# Patient Record
Sex: Female | Born: 2000 | Race: Black or African American | Hispanic: No | Marital: Single | Smoking: Never smoker
Health system: Southern US, Community
[De-identification: ages and names within clinical notes are randomized; demographics above are authoritative.]

## PROBLEM LIST (undated history)

## (undated) DIAGNOSIS — K219 Gastro-esophageal reflux disease without esophagitis: Secondary | ICD-10-CM

## (undated) DIAGNOSIS — J45909 Unspecified asthma, uncomplicated: Secondary | ICD-10-CM

## (undated) DIAGNOSIS — R569 Unspecified convulsions: Secondary | ICD-10-CM

## (undated) HISTORY — PX: MOUTH SURGERY: SHX715

## (undated) HISTORY — DX: Unspecified convulsions: R56.9

---

## 2018-03-20 ENCOUNTER — Encounter (HOSPITAL_COMMUNITY): Payer: Self-pay | Admitting: *Deleted

## 2018-03-20 ENCOUNTER — Emergency Department (HOSPITAL_COMMUNITY)
Admission: EM | Admit: 2018-03-20 | Discharge: 2018-03-20 | Disposition: A | Discharge status: Home / Self Care | Payer: BLUE CROSS/BLUE SHIELD | Attending: Emergency Medicine | Admitting: Emergency Medicine

## 2018-03-20 ENCOUNTER — Emergency Department (HOSPITAL_COMMUNITY): Payer: BLUE CROSS/BLUE SHIELD

## 2018-03-20 DIAGNOSIS — R1031 Right lower quadrant pain: Secondary | ICD-10-CM | POA: Insufficient documentation

## 2018-03-20 DIAGNOSIS — J45909 Unspecified asthma, uncomplicated: Secondary | ICD-10-CM | POA: Diagnosis not present

## 2018-03-20 DIAGNOSIS — M549 Dorsalgia, unspecified: Secondary | ICD-10-CM

## 2018-03-20 HISTORY — DX: Unspecified asthma, uncomplicated: J45.909

## 2018-03-20 LAB — COMPREHENSIVE METABOLIC PANEL
ALK PHOS: 54 U/L (ref 38–126)
ALT: 16 U/L (ref 0–44)
AST: 24 U/L (ref 15–41)
Albumin: 3.5 g/dL (ref 3.5–5.0)
Anion gap: 9 (ref 5–15)
BILIRUBIN TOTAL: 0.7 mg/dL (ref 0.3–1.2)
BUN: 7 mg/dL (ref 6–20)
CALCIUM: 8.6 mg/dL — AB (ref 8.9–10.3)
CHLORIDE: 105 mmol/L (ref 98–111)
CO2: 24 mmol/L (ref 22–32)
CREATININE: 0.8 mg/dL (ref 0.44–1.00)
Glucose, Bld: 98 mg/dL (ref 70–99)
Potassium: 3.6 mmol/L (ref 3.5–5.1)
Sodium: 138 mmol/L (ref 135–145)
TOTAL PROTEIN: 7.5 g/dL (ref 6.5–8.1)

## 2018-03-20 LAB — CBC WITH DIFFERENTIAL/PLATELET
Basophils Absolute: 0 10*3/uL (ref 0.0–0.1)
Basophils Relative: 0 %
EOS PCT: 0 %
Eosinophils Absolute: 0 10*3/uL (ref 0.0–0.7)
HEMATOCRIT: 36 % (ref 36.0–46.0)
Hemoglobin: 11.4 g/dL — ABNORMAL LOW (ref 12.0–15.0)
LYMPHS ABS: 1.8 10*3/uL (ref 0.7–4.0)
LYMPHS PCT: 18 %
MCH: 27.3 pg (ref 26.0–34.0)
MCHC: 31.7 g/dL (ref 30.0–36.0)
MCV: 86.3 fL (ref 78.0–100.0)
Monocytes Absolute: 1.6 10*3/uL — ABNORMAL HIGH (ref 0.1–1.0)
Monocytes Relative: 16 %
NEUTROS ABS: 6.6 10*3/uL (ref 1.7–7.7)
Neutrophils Relative %: 66 %
Platelets: 299 10*3/uL (ref 150–400)
RBC: 4.17 MIL/uL (ref 3.87–5.11)
RDW: 14.9 % (ref 11.5–15.5)
WBC: 9.9 10*3/uL (ref 4.0–10.5)

## 2018-03-20 LAB — URINALYSIS, ROUTINE W REFLEX MICROSCOPIC
BILIRUBIN URINE: NEGATIVE
Glucose, UA: NEGATIVE mg/dL
Ketones, ur: NEGATIVE mg/dL
NITRITE: NEGATIVE
PH: 6 (ref 5.0–8.0)
Protein, ur: 30 mg/dL — AB
SPECIFIC GRAVITY, URINE: 1.004 — AB (ref 1.005–1.030)
WBC, UA: >50 WBC/hpf — ABNORMAL HIGH (ref 0–5)

## 2018-03-20 LAB — LIPASE, BLOOD: Lipase: 25 U/L (ref 11–51)

## 2018-03-20 LAB — PREGNANCY, URINE: PREG TEST UR: NEGATIVE

## 2018-03-20 MED ORDER — KETOROLAC TROMETHAMINE 15 MG/ML IJ SOLN
15.0000 mg | Freq: Once | INTRAMUSCULAR | Status: AC
  Administered 2018-03-20: 15 mg via INTRAVENOUS
  Filled 2018-03-20: qty 1

## 2018-03-20 MED ORDER — ONDANSETRON HCL 4 MG PO TABS: 4.0000 mg | ORAL_TABLET | Freq: Three times a day (TID) | ORAL | 0 refills | Status: DC | PRN

## 2018-03-20 MED ORDER — NAPROXEN 500 MG PO TABS: 500.0000 mg | ORAL_TABLET | Freq: Two times a day (BID) | ORAL | 0 refills | Status: DC

## 2018-03-20 MED ORDER — SODIUM CHLORIDE 0.9 % IV BOLUS
1000.0000 mL | Freq: Once | INTRAVENOUS | Status: AC
  Administered 2018-03-20: 1000 mL via INTRAVENOUS

## 2018-03-20 MED ORDER — ONDANSETRON HCL 4 MG/2ML IJ SOLN
4.0000 mg | Freq: Once | INTRAMUSCULAR | Status: AC
  Administered 2018-03-20: 4 mg via INTRAVENOUS
  Filled 2018-03-20: qty 2

## 2018-03-20 NOTE — ED Provider Notes (Signed)
[facility]-EMERGENCY DEPT Provider Note   CSN: [id number] Arrival date & time: 03/20/18  1319     History   Chief Complaint Chief Complaint  Patient presents with  . Flank Pain    HPI Brittney Garner is a 17 y.o. female presenting for evaluation of right-sided flank pain.  Patient states the past 2 days, she has been having right-sided flank pain.  Patient states is gradually been getting worse.  She denies triggering episode.  She denies increased activity or movement recently.  She denies fall, trauma, or injury.  She reports pain occasionally radiates down to her foot.  She has taken 1 dose of extra strength Tylenol without improvement of her symptoms, has not taken anything else.  She reports decreased appetite for the past day and a half.  She denies headache, fevers, chills, vision changes, chest pain, shortness of breath, nausea, vomiting, abdominal pain, urinary symptoms, abnormal bowel movements.  She has a history of asthma and allergies, for which he takes Zyrtec and Qvar.  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  Recently moved, however had movers to help her.  HPI  Past Medical History:  Diagnosis Date  . Asthma     There are no active problems to display for this patient.   History reviewed. No pertinent surgical history.   OB History   None      Home Medications    Prior to Admission medications   Medication Sig Start Date End Date Taking? Authorizing Provider  naproxen (NAPROSYN) 500 MG tablet Take 1 tablet (500 mg total) by mouth 2 (two) times daily with a meal. 03/20/18   Zyier Dykema, PA-C  ondansetron (ZOFRAN) 4 MG tablet Take 1 tablet (4 mg total) by mouth every 8 (eight) hours as needed. 03/20/18   Chrystopher Stangl, PA-C    Family History No family history on file.  Social History Social History   Tobacco Use  . Smoking status: Not on file  Substance Use Topics  . Alcohol use: Not on file  . Drug  use: Not on file     Allergies   Patient has no allergy information on record.   Review of Systems Review of Systems  Constitutional: Positive for appetite change.  Genitourinary: Positive for flank pain.  Musculoskeletal: Positive for back pain.  All other systems reviewed and are negative.    Physical Exam Updated Vital Signs BP 104/65 (BP Location: Right Arm)   Pulse (!) 120 Comment: pt crying  Temp 99 F (37.2 C) (Oral)   Resp 16   Ht 5\' 5"  (1.651 m)   Wt 67.1 kg   LMP 03/13/2018 Comment: negative urine pregnancy test 03-20-2018  SpO2 100%   BMI 24.63 kg/m   Physical Exam  Constitutional: She is oriented to person, place, and time. She appears well-developed and well-nourished. No distress.  Sitting in the chair in no acute distress  HENT:  Head: Normocephalic and atraumatic.  Eyes: Pupils are equal, round, and reactive to light. Conjunctivae and EOM are normal.  Neck: Normal range of motion. Neck supple.  Cardiovascular: Regular rhythm and intact distal pulses.  Tachycardic around 105  Pulmonary/Chest: Effort normal and breath sounds normal. No respiratory distress. She has no wheezes.  Abdominal: Soft. She exhibits no distension and no mass. There is no tenderness. There is no guarding.  No tenderness palpation of the anterior abdomen.  Soft without rigidity, guarding, distention.  Negative rebound.  No signs of peritonitis.  Musculoskeletal: Normal range of motion.  Tenderness palpation of right flank and low back musculature.  Strength intact x4.  Sensation intact x4.  Radial pedal pulses intact bilaterally.  No tenderness palpation over midline spine.  Neurological: She is alert and oriented to person, place, and time. No sensory deficit.  Skin: Skin is warm and dry. Capillary refill takes less than 2 seconds.  Psychiatric: She has a normal mood and affect.  Nursing note and vitals reviewed.    ED Treatments / Results  Labs (all labs ordered are listed,  but only abnormal results are displayed) Labs Reviewed  URINALYSIS, ROUTINE W REFLEX MICROSCOPIC - Abnormal; Notable for the following components:      Result Value   APPearance CLOUDY (*)    Specific Gravity, Urine 1.004 (*)    Hgb urine dipstick MODERATE (*)    Protein, ur 30 (*)    Leukocytes, UA LARGE (*)    WBC, UA >50 (*)    Bacteria, UA RARE (*)    All other components within normal limits  CBC WITH DIFFERENTIAL/PLATELET - Abnormal; Notable for the following components:   Hemoglobin 11.4 (*)    Monocytes Absolute 1.6 (*)    All other components within normal limits  COMPREHENSIVE METABOLIC PANEL - Abnormal; Notable for the following components:   Calcium 8.6 (*)    All other components within normal limits  URINE CULTURE  PREGNANCY, URINE  LIPASE, BLOOD    EKG None  Radiology Ct Renal Stone Study  Result Date: 03/20/2018 CLINICAL DATA:  Initial evaluation for acute right flank pain. EXAM: CT ABDOMEN AND PELVIS WITHOUT CONTRAST TECHNIQUE: Multidetector CT imaging of the abdomen and pelvis was performed following the standard protocol without IV contrast. COMPARISON:  None available. FINDINGS: Lower chest: Visualized lung bases are clear. Hepatobiliary: Limited noncontrast evaluation of the liver is unremarkable. Gallbladder within normal limits. No biliary dilatation. Pancreas: Pancreas within normal limits. Spleen: Spleen within normal limits. Adrenals/Urinary Tract: Adrenal glands unremarkable. Kidneys equal in size without nephrolithiasis or hydronephrosis. Right kidney mildly externally rotated. No radiopaque calculi seen along the course of either renal collecting system. No appreciable hydroureter. Partially distended bladder within normal limits. No layering stones within the bladder lumen. Punctate calcification within the left hemipelvis most consistent with a vascular phlebolith. Stomach/Bowel: Stomach within normal limits. No evidence for bowel obstruction. Normal  appendix. No acute inflammatory changes seen about the bowels. Vascular/Lymphatic: Intra-abdominal aorta of normal caliber. No appreciable adenopathy. Reproductive: Uterus and ovaries within normal limits. Other: No free air or fluid. Musculoskeletal: No acute osseous abnormality. No worrisome lytic or blastic osseous lesions. IMPRESSION: 1. No CT evidence for nephrolithiasis or obstructive uropathy. 2. No other acute intra-abdominal or pelvic process identified. Electronically Signed   By: Rise MuBenjamin  McClintock M.D.   On: 03/20/2018 15:39    Procedures Procedures (including critical care time)  Medications Ordered in ED Medications  sodium chloride 0.9 % bolus 1,000 mL (0 mLs Intravenous Stopped 03/20/18 1617)  ketorolac (TORADOL) 15 MG/ML injection 15 mg (15 mg Intravenous Given 03/20/18 1543)  ondansetron (ZOFRAN) injection 4 mg (4 mg Intravenous Given 03/20/18 1543)     Initial Impression / Assessment and Plan / ED Course  I have reviewed the triage vital signs and the nursing notes.  Pertinent labs & imaging results that were available during my care of the patient were reviewed by me and considered in my medical decision making (see chart for details).     Patient presenting for evaluation of  right-sided back/flank pain.  History points to multiple different potential etiologies.  Reporting radiation to her feet, consistent with sciatica.  However, patient also reporting nausea/decreased appetite.  CVA tenderness on the side.  Urine with moderate blood and large leuks.  No urinary symptoms such as hematuria, dysuria, urinary frequency.  Will not empirically treat for UTI, as patient is asymptomatic.  Will obtain labs, CT renal, treat symptoms, and reassess.  On reassessment, patient reports improvement of symptoms.  She states she is pain-free.  Heart rate has improved to 90.  Appears in no distress.  CT renal negative for stone, or obvious intra-abdominal infection.  Labs reassuring, no  leukocytosis.  Kidney, liver, pancreatic function reassuring.  Discussed with patient.  Discussed treatment with NSAIDs, muscle creams, and stretching.  Encouraged follow-up with PCP as needed.  Urine culture sent.  At this time, patient appears safe for discharge.  Return precautions given.  Patient states she understands and agrees to plan.  Final Clinical Impressions(s) / ED Diagnoses   Final diagnoses:  Acute right-sided back pain, unspecified back location    ED Discharge Orders         Ordered    naproxen (NAPROSYN) 500 MG tablet  2 times daily with meals     03/20/18 1704    ondansetron (ZOFRAN) 4 MG tablet  Every 8 hours PRN     03/20/18 1704           Urijah Arko, PA-C 03/20/18 1715    Linwood Dibbles, MD 03/23/18 [phone]

## 2018-03-20 NOTE — ED Triage Notes (Signed)
Pt complains of right flank pain and weakness x 2 days. Pain is worse with movement. Pt denies urinary symptoms or fever.

## 2018-03-20 NOTE — Discharge Instructions (Signed)
While in the ER today, we checked to make sure you do not have a kidney stone, kidney infection, intra-abdominal infection, or other acute or concerning pathology. Take naproxen twice a day with meals.  Do not take other anti-inflammatory's at the same time. Use Zofran as needed for nausea or vomiting.  Make sure you are staying well-hydrated.  This is very important. Your urine was sent for culture, if it grows out bacteria, you will receive a phone call to discuss the need to start antibiotics. Try the back stretches that are provided in the paperwork. Use muscle cream such as salonpas, icy hot, or BenGay to help with your pain. Follow-up with student health or your primary care doctor if your symptoms are not improving with the treatment. Return to the emergency room if you develop any new, worsening, or concerning symptoms.

## 2018-03-22 LAB — URINE CULTURE

## 2018-03-23 ENCOUNTER — Telehealth: Payer: Self-pay | Admitting: Emergency Medicine

## 2018-03-23 NOTE — Telephone Encounter (Signed)
Post ED Visit - Positive Culture Follow-up  Culture report reviewed by antimicrobial stewardship pharmacist:  []  Brittney Garner, Pharm.D. []  Brittney Garner, Pharm.D., BCPS AQ-ID []  Brittney Garner, Pharm.D., BCPS []  Brittney Garner, Pharm.D., BCPS []  Brittney Garner, [address]Pharm.D., BCPS, AAHIVP []  Brittney Garner, Pharm.D., BCPS, AAHIVP []  Brittney Garner, PharmD, BCPS []  Brittney Garner, PharmD, BCPS []  Brittney Garner, PharmD, BCPS []  Brittney Garner, PharmD  Positive urine culture Treated with none, asymptomatic,no further patient follow-up is required at this time.  Brittney Garner, Brittney Garner 03/23/2018, 3:52 PM

## 2018-06-15 ENCOUNTER — Emergency Department (HOSPITAL_COMMUNITY)
Admission: EM | Admit: 2018-06-15 | Discharge: 2018-06-16 | Disposition: A | Discharge status: Home / Self Care | Payer: BLUE CROSS/BLUE SHIELD | Attending: Emergency Medicine | Admitting: Emergency Medicine

## 2018-06-15 ENCOUNTER — Encounter (HOSPITAL_COMMUNITY): Payer: Self-pay

## 2018-06-15 DIAGNOSIS — Y999 Unspecified external cause status: Secondary | ICD-10-CM | POA: Diagnosis not present

## 2018-06-15 DIAGNOSIS — Z1889 Other specified retained foreign body fragments: Secondary | ICD-10-CM | POA: Diagnosis not present

## 2018-06-15 DIAGNOSIS — Y929 Unspecified place or not applicable: Secondary | ICD-10-CM | POA: Diagnosis not present

## 2018-06-15 DIAGNOSIS — Y939 Activity, unspecified: Secondary | ICD-10-CM | POA: Insufficient documentation

## 2018-06-15 DIAGNOSIS — T162XXA Foreign body in left ear, initial encounter: Secondary | ICD-10-CM

## 2018-06-15 DIAGNOSIS — Z79899 Other long term (current) drug therapy: Secondary | ICD-10-CM | POA: Insufficient documentation

## 2018-06-15 DIAGNOSIS — X58XXXA Exposure to other specified factors, initial encounter: Secondary | ICD-10-CM | POA: Diagnosis not present

## 2018-06-15 DIAGNOSIS — Z91040 Latex allergy status: Secondary | ICD-10-CM | POA: Diagnosis not present

## 2018-06-15 DIAGNOSIS — J45901 Unspecified asthma with (acute) exacerbation: Secondary | ICD-10-CM | POA: Diagnosis not present

## 2018-06-15 DIAGNOSIS — S0991XA Unspecified injury of ear, initial encounter: Secondary | ICD-10-CM | POA: Diagnosis present

## 2018-06-15 HISTORY — DX: Gastro-esophageal reflux disease without esophagitis: K21.9

## 2018-06-15 MED ORDER — ALBUTEROL SULFATE (2.5 MG/3ML) 0.083% IN NEBU
5.0000 mg | INHALATION_SOLUTION | Freq: Once | RESPIRATORY_TRACT | Status: AC
  Administered 2018-06-15: 5 mg via RESPIRATORY_TRACT
  Filled 2018-06-15: qty 6

## 2018-06-15 NOTE — ED Triage Notes (Signed)
Pt states that she had an asthma attack today, took home medications without relief, dry cough and chest tightness with coughing

## 2018-06-16 ENCOUNTER — Emergency Department (HOSPITAL_COMMUNITY): Payer: BLUE CROSS/BLUE SHIELD

## 2018-06-16 MED ORDER — LIDOCAINE HCL (PF) 1 % IJ SOLN
5.0000 mL | Freq: Once | INTRAMUSCULAR | Status: AC
  Administered 2018-06-16: 5 mL
  Filled 2018-06-16: qty 5

## 2018-06-16 MED ORDER — PREDNISONE 20 MG PO TABS
60.0000 mg | ORAL_TABLET | Freq: Once | ORAL | Status: AC
  Administered 2018-06-16: 60 mg via ORAL
  Filled 2018-06-16: qty 3

## 2018-06-16 MED ORDER — IPRATROPIUM-ALBUTEROL 0.5-2.5 (3) MG/3ML IN SOLN
3.0000 mL | Freq: Once | RESPIRATORY_TRACT | Status: AC
  Administered 2018-06-16: 3 mL via RESPIRATORY_TRACT
  Filled 2018-06-16: qty 3

## 2018-06-16 MED ORDER — IBUPROFEN 800 MG PO TABS
800.0000 mg | ORAL_TABLET | Freq: Once | ORAL | Status: AC
  Administered 2018-06-16: 800 mg via ORAL
  Filled 2018-06-16: qty 1

## 2018-06-16 MED ORDER — PREDNISONE 20 MG PO TABS: 40.0000 mg | ORAL_TABLET | Freq: Every day | ORAL | 0 refills | Status: DC

## 2018-06-16 NOTE — ED Notes (Signed)
ED Provider at bedside. 

## 2018-06-16 NOTE — ED Provider Notes (Signed)
[facility] EMERGENCY DEPARTMENT Provider Note   CSN: [id number] Arrival date & time: 06/15/18  2340     History   Chief Complaint Chief Complaint  Patient presents with  . Asthma    HPI Brittney Garner is a 17 y.o. female.  Patient presents to the emergency department with a chief complaint of asthma exacerbation.  She reports associated dry cough and chest tightness.  The symptoms are worsened with coughing.  She denies any fevers or chills.  She does report having history of pneumothorax.  She has tried using her at-home inhalers with little relief.  Additionally, patient complains of foreign body in left earlobe.  She denies any discharge or swelling at the site.  The history is provided by the patient. No language interpreter was used.    Past Medical History:  Diagnosis Date  . Asthma   . GERD (gastroesophageal reflux disease)     There are no active problems to display for this patient.   History reviewed. No pertinent surgical history.   OB History   None      Home Medications    Prior to Admission medications   Medication Sig Start Date End Date Taking? Authorizing Provider  naproxen (NAPROSYN) 500 MG tablet Take 1 tablet (500 mg total) by mouth 2 (two) times daily with a meal. 03/20/18   Caccavale, Sophia, PA-C  ondansetron (ZOFRAN) 4 MG tablet Take 1 tablet (4 mg total) by mouth every 8 (eight) hours as needed. 03/20/18   Caccavale, Sophia, PA-C    Family History No family history on file.  Social History Social History   Tobacco Use  . Smoking status: Not on file  Substance Use Topics  . Alcohol use: Not on file  . Drug use: Not on file     Allergies   Iodine; Other; Shellfish allergy; and Latex   Review of Systems Review of Systems  All other systems reviewed and are negative.    Physical Exam Updated Vital Signs BP 124/73   Pulse 99   Temp 98.4 F (36.9 C) (Oral)   Resp 18   LMP 06/01/2018   SpO2 98%    Physical Exam  Constitutional: She is oriented to person, place, and time. She appears well-developed and well-nourished.  HENT:  Head: Normocephalic and atraumatic.  Stud type earring is embedded in the left earlobe  Eyes: Pupils are equal, round, and reactive to light. Conjunctivae and EOM are normal.  Neck: Normal range of motion. Neck supple.  Cardiovascular: Normal rate and regular rhythm. Exam reveals no gallop and no friction rub.  No murmur heard. Pulmonary/Chest: Effort normal. No respiratory distress. She has no wheezes. She has no rales. She exhibits no tenderness.  Diminished lung sounds  Abdominal: Soft. Bowel sounds are normal. She exhibits no distension and no mass. There is no tenderness. There is no rebound and no guarding.  Musculoskeletal: Normal range of motion. She exhibits no edema or tenderness.  Neurological: She is alert and oriented to person, place, and time.  Skin: Skin is warm and dry.  Psychiatric: She has a normal mood and affect. Her behavior is normal. Judgment and thought content normal.  Nursing note and vitals reviewed.    ED Treatments / Results  Labs (all labs ordered are listed, but only abnormal results are displayed) Labs Reviewed - No data to display  EKG None  Radiology No results found.  Procedures .Foreign Body Removal Date/Time: 06/16/2018 1:46 AM Performed by: Roxy Horseman, PA-C  Authorized by: Roxy HorsemanBrowning, Ember Gottwald, PA-C  Consent: Verbal consent obtained. Risks and benefits: risks, benefits and alternatives were discussed Patient understanding: patient states understanding of the procedure being performed Patient consent: the patient's understanding of the procedure matches consent given Procedure consent: procedure consent matches procedure scheduled Relevant documents: relevant documents present and verified Test results: test results available and properly labeled Site marked: the operative site was marked Imaging  studies: imaging studies available Required items: required blood products, implants, devices, and special equipment available Patient identity confirmed: verbally with patient Time out: Immediately prior to procedure a "time out" was called to verify the correct patient, procedure, equipment, support staff and site/side marked as required. Intake: left ear lobe. Anesthesia: local infiltration  Anesthesia: Local Anesthetic: lidocaine 1% without epinephrine  Sedation: Patient sedated: no  Patient restrained: no Patient cooperative: yes Complexity: simple 1 objects recovered. Objects recovered: ear ring Post-procedure assessment: foreign body removed Patient tolerance: Patient tolerated the procedure well with no immediate complications   (including critical care time)  Medications Ordered in ED Medications  predniSONE (DELTASONE) tablet 60 mg (has no administration in time range)  ipratropium-albuterol (DUONEB) 0.5-2.5 (3) MG/3ML nebulizer solution 3 mL (has no administration in time range)  lidocaine (PF) (XYLOCAINE) 1 % injection 5 mL (has no administration in time range)  albuterol (PROVENTIL) (2.5 MG/3ML) 0.083% nebulizer solution 5 mg (5 mg Nebulization Given 06/15/18 2358)     Initial Impression / Assessment and Plan / ED Course  I have reviewed the triage vital signs and the nursing notes.  Pertinent labs & imaging results that were available during my care of the patient were reviewed by me and considered in my medical decision making (see chart for details).     Patient with mild asthma exacerbation.  Has had good improvement with breathing treatment and steroid in the ED.  Has a history of pneumothorax, but chest x-ray reveals no pneumothorax.  No evidence of infection.  O2 sat is normal.  Patient has an no acute distress.  Additionally, patient complains of foreign body in her left earlobe.  She has a hearing that is ingrown.  This was removed in the ED.  Final  Clinical Impressions(s) / ED Diagnoses   Final diagnoses:  Exacerbation of asthma, unspecified asthma severity, unspecified whether persistent  Ear foreign body, left, initial encounter    ED Discharge Orders         Ordered    predniSONE (DELTASONE) 20 MG tablet  Daily     06/16/18 0145           Roxy HorsemanBrowning, Jaaron Oleson, PA-C 06/16/18 0148    Dione BoozeGlick, David, MD 06/16/18 [phone]

## 2018-06-16 NOTE — ED Notes (Signed)
Patient transported to X-ray 

## 2018-08-24 ENCOUNTER — Encounter: Payer: Self-pay | Admitting: Neurology

## 2018-09-24 ENCOUNTER — Ambulatory Visit: Payer: BLUE CROSS/BLUE SHIELD | Admitting: Neurology

## 2018-09-24 ENCOUNTER — Encounter: Payer: Self-pay | Admitting: Neurology

## 2018-09-24 ENCOUNTER — Other Ambulatory Visit: Payer: Self-pay

## 2018-09-24 VITALS — BP 118/72 | HR 71 | Ht 65.0 in | Wt 160.0 lb

## 2018-09-24 DIAGNOSIS — G40309 Generalized idiopathic epilepsy and epileptic syndromes, not intractable, without status epilepticus: Secondary | ICD-10-CM | POA: Diagnosis not present

## 2018-09-24 MED ORDER — ZONISAMIDE 100 MG PO CAPS: ORAL_CAPSULE | ORAL | 11 refills | Status: DC

## 2018-09-24 MED ORDER — LEVETIRACETAM 1000 MG PO TABS: 1000.0000 mg | ORAL_TABLET | Freq: Two times a day (BID) | ORAL | 2 refills | Status: DC
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
## 2018-09-24 NOTE — Patient Instructions (Addendum)
1. Records from [facility] will be requested for review  2. Start Zonisamide 100mg : Take 1 capsule every night for 2 weeks, then increase to 2 capsules every night for 2 weeks, then increase to 3 capsules every night and continue  3. Continue Keppra 1000mg  twice a day for another 6 weeks as we increase the Zonisamide. Once on 3 caps of new medication, reduce Keppra to 1/2 tablet twice a day for 1 week, the 1/2 at night for a week, then stop  4. You can try 1/2 tablet of the Ativan as needed around the time of your period and when you feel you are about to have a seizure  5. Recommend talking to your school counselor  6. Contact our office in 2 months on how you are feeling, follow-up in 3 months, call for any issues  Seizure Precautions: 1. If medication has been prescribed for you to prevent seizures, take it exactly as directed.  Do not stop taking the medicine without talking to your doctor first, even if you have not had a seizure in a long time.   2. Avoid activities in which a seizure would cause danger to yourself or to others.  Don't operate dangerous machinery, swim alone, or climb in high or dangerous places, such as on ladders, roofs, or girders.  Do not drive unless your doctor says you may.  3. If you have any warning that you may have a seizure, lay down in a safe place where you can't hurt yourself.    4.  No driving for 6 months from last seizure, as per [facility].   Please refer to the following link on the Epilepsy Foundation of America's website for more information: http://www.epilepsyfoundation.org/answerplace/Social/driving/drivingu.cfm   5.  Maintain good sleep hygiene. Avoid alcohol  6.  Notify your neurology if you are planning pregnancy or if you become pregnant.  7.  Contact your doctor if you have any problems that may be related to the medicine you are taking.  8.  Call 911 and bring the patient back to the ED if:        A.  The seizure lasts longer  than 5 minutes.       B.  The patient doesn't awaken shortly after the seizure  C.  The patient has new problems such as difficulty seeing, speaking or moving  D.  The patient was injured during the seizure  E.  The patient has a temperature over 102 F (39C)  F.  The patient vomited and now is having trouble breathing

## 2018-10-19 ENCOUNTER — Telehealth: Payer: Self-pay | Admitting: Neurology

## 2018-10-19 NOTE — Telephone Encounter (Signed)
Medication Keppra and other pill (red and white) is keeping her awake and then she is sleeping for a long time when she finally falls asleep. She is really sluggish during the day. Please call her back at [phone]. Thanks!

## 2018-10-20 NOTE — Telephone Encounter (Signed)
How much of zonisamide is she taking now, 1 or 2 caps? If 2, reduce to 1. If 1, reduce to every other night. How may seizures in the past month has she had? Thanks

## 2018-10-20 NOTE — Telephone Encounter (Signed)
Returned call to pt to gather information below as well as to advise about medications.  No answer.  LMOM asking for return call to the office to get this information.

## 2018-10-20 NOTE — Telephone Encounter (Signed)
I'm assuming she is talking about Zonisamide.  Pls advise

## 2018-10-22 ENCOUNTER — Telehealth: Payer: Self-pay

## 2018-10-22 NOTE — Telephone Encounter (Signed)
Spoke with pt.  She states that she had increased her Zonisamide to 3 cap QHS last night.  I advised her to take 2 caps tonight, then 1 cap QHS thereafter.    Advised that I would write requested letter x2.  Pt requests that letters be mail to her home via [employer].  Verified pt's mailing address.  Pt states that she her legs have been falling asleep more often.  States that she has to "roll out of bed" because they are asleep.  Asks if this could be cause by her medications or due to her seizures?  pls advise.

## 2018-10-22 NOTE — Telephone Encounter (Signed)
Ok for letter for work and school. Thanks for updating on med concerns/issues, hopefully we can stay on zonisamide but there are other medications we can try if necessary. Thanks

## 2018-10-22 NOTE — Telephone Encounter (Signed)
Received VM from pt stating that she experienced a seizure on Tuesday and her school/work (could not really understand which) was concerned that she did not seek medical attention.  States that she would like a letter for work and school stating that seizures do not always require medical attention, especially now.    Will return call in regards to previous phone encounter [facility] for pt, did not receive call back)  Dr. Karel Jarvis - pls advise about letter.

## 2018-10-22 NOTE — Telephone Encounter (Signed)
I don't think they are due to the seizures. Zonisamide can sometimes cause numbness/tingling in arms and legs. Let's try going back down to 1 cap every night and see how she does. Thanks

## 2018-10-23 NOTE — Telephone Encounter (Signed)
Spoke with pt relaying message below.  She asks if she is to stop Keppra now.  I advised that she still has 2 weeks left per taper instructions.  Pt states that she took 1 Tab Keppra yesterday.  I advised her to start half tab BID today x1week, then reduce to half tab QHS x1week then stop Keppra all together.  Pt verbalized understanding.

## 2018-10-29 ENCOUNTER — Ambulatory Visit: Payer: BLUE CROSS/BLUE SHIELD | Admitting: Neurology

## 2018-10-29 ENCOUNTER — Encounter

## 2018-11-25 ENCOUNTER — Telehealth: Payer: Self-pay | Admitting: Neurology

## 2018-11-25 NOTE — Telephone Encounter (Signed)
Patient called to check on her appointment date and time which we confirmed for her. She also wanted to know if she would still be contacted to schedule her EEG for home. Please Call. Thanks

## 2018-11-25 NOTE — Telephone Encounter (Signed)
Pt called no answer voice mail left that we are not doing EEG testing at this time and that when we are able to do them someone from the office will be calling her to get her scheduled.

## 2018-12-11 ENCOUNTER — Telehealth: Payer: Self-pay | Admitting: *Deleted

## 2018-12-11 NOTE — Telephone Encounter (Signed)
LMOM we can set up her 72 hour ambulatory EEG appointment for Tuesday May 26th at 2:30 to return Friday May 29th at 3:30. Or Friday May 29th 3:30 to Monday June 1st 4:30 for removal. Asked her to return my call with her calendar handy if neither of those times work for her.

## 2018-12-22 ENCOUNTER — Other Ambulatory Visit: Payer: Self-pay

## 2018-12-22 ENCOUNTER — Ambulatory Visit: Payer: BLUE CROSS/BLUE SHIELD | Admitting: Neurology

## 2018-12-22 ENCOUNTER — Ambulatory Visit (INDEPENDENT_AMBULATORY_CARE_PROVIDER_SITE_OTHER): Payer: BLUE CROSS/BLUE SHIELD | Admitting: Neurology

## 2018-12-22 DIAGNOSIS — G40309 Generalized idiopathic epilepsy and epileptic syndromes, not intractable, without status epilepticus: Secondary | ICD-10-CM

## 2018-12-31 NOTE — Procedures (Signed)
ELECTROENCEPHALOGRAM REPORT  Dates of Recording: 12/22/2018 2:56PM to 12/25/2018 3:24PM  Patient's Name: Brittney Garner MRN: [id number] Date of Birth: 06-Apr-2001  Referring Provider: Dr. Patrcia Dolly  Procedure: 72-hour ambulatory EEG  History: This is a 18 year old woman with recurrent seizures with report of eyelid fluttering, myoclonic jerks, and GTCs, as well as epigastric sensation, anxiety, left arm numbness. EEG for classification.  Medications: Zonisamide  Technical Summary: This is a 72-hour multichannel digital EEG recording measured by the international 10-20 system with electrodes applied with paste and impedances below 5000 ohms performed as portable with EKG monitoring.  The digital EEG was referentially recorded, reformatted, and digitally filtered in a variety of bipolar and referential montages for optimal display.    DESCRIPTION OF RECORDING: During maximal wakefulness, the background activity consisted of a symmetric 10 Hz posterior dominant rhythm which was reactive to eye opening.  There were no epileptiform discharges or focal slowing seen in wakefulness.  During the recording, the patient progresses through wakefulness, drowsiness, and Stage 2 sleep.  Again, there were no epileptiform discharges seen.  Events: On 5/26 at 1530 hours, she reports eye twitch. No video recorded. Electrographically, there were no EEG or EKG changes seen.  On 5/27 at 1055 hours, she reports a headache. Electrographically, there were no EEG or EKG changes seen.  There were no electrographic seizures seen.  EKG lead was unremarkable.  IMPRESSION: This 72-hour ambulatory EEG study is normal.    CLINICAL CORRELATION: A normal EEG does not exclude a clinical diagnosis of epilepsy. Typical events were not captured. Eye twitch and headache episodes did not show EEG correlate.  If further clinical questions remain, inpatient video EEG monitoring may be helpful.   Patrcia Dolly, M.D.

## 2019-01-01 ENCOUNTER — Other Ambulatory Visit: Payer: Self-pay

## 2019-01-01 ENCOUNTER — Telehealth (INDEPENDENT_AMBULATORY_CARE_PROVIDER_SITE_OTHER): Payer: BLUE CROSS/BLUE SHIELD | Admitting: Neurology

## 2019-01-01 VITALS — Ht 65.0 in | Wt 136.0 lb

## 2019-01-01 DIAGNOSIS — F419 Anxiety disorder, unspecified: Secondary | ICD-10-CM

## 2019-01-01 DIAGNOSIS — G40309 Generalized idiopathic epilepsy and epileptic syndromes, not intractable, without status epilepticus: Secondary | ICD-10-CM | POA: Diagnosis not present

## 2019-01-01 DIAGNOSIS — R569 Unspecified convulsions: Secondary | ICD-10-CM | POA: Insufficient documentation

## 2019-01-01 MED ORDER — ZONISAMIDE 100 MG PO CAPS: ORAL_CAPSULE | ORAL | 3 refills | Status: DC

## 2019-01-01 MED ORDER — LORAZEPAM 0.5 MG PO TABS: ORAL_TABLET | ORAL | 5 refills | Status: DC
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
## 2019-01-06 ENCOUNTER — Telehealth: Payer: Self-pay | Admitting: Neurology

## 2019-01-06 ENCOUNTER — Encounter: Payer: Self-pay | Admitting: Neurology

## 2019-01-06 NOTE — Telephone Encounter (Signed)
Pls let her know letter cannot be emailed, we can mail to her address or she can pick it up, thanks

## 2019-01-06 NOTE — Telephone Encounter (Signed)
Pt called will fax work note to [employer] at number [phone] att Danae Chen

## 2019-01-06 NOTE — Telephone Encounter (Signed)
Patient's mom left msg with after hours about patient needing a note to go back to work. Please send this to her E-mail at [email or web address]. Thanks!

## 2019-02-15 ENCOUNTER — Encounter (HOSPITAL_COMMUNITY): Payer: Self-pay

## 2019-02-15 ENCOUNTER — Other Ambulatory Visit: Payer: Self-pay

## 2019-02-15 DIAGNOSIS — Z5321 Procedure and treatment not carried out due to patient leaving prior to being seen by health care provider: Secondary | ICD-10-CM | POA: Insufficient documentation

## 2019-02-15 DIAGNOSIS — R319 Hematuria, unspecified: Secondary | ICD-10-CM | POA: Diagnosis not present

## 2019-02-15 DIAGNOSIS — R200 Anesthesia of skin: Secondary | ICD-10-CM | POA: Diagnosis present

## 2019-02-15 NOTE — ED Triage Notes (Signed)
Pt states that her left leg and arm are numb. Pt states she noticed blood in her urine as well. Pt ambulatory in triage.

## 2019-02-16 ENCOUNTER — Emergency Department (HOSPITAL_COMMUNITY)
Admission: EM | Admit: 2019-02-16 | Discharge: 2019-02-16 | Discharge status: Against Medical Advice | Payer: BLUE CROSS/BLUE SHIELD | Attending: Emergency Medicine | Admitting: Emergency Medicine

## 2019-03-25 ENCOUNTER — Ambulatory Visit (HOSPITAL_COMMUNITY): Payer: BLUE CROSS/BLUE SHIELD | Admitting: Psychiatry

## 2019-03-25 ENCOUNTER — Other Ambulatory Visit: Payer: Self-pay

## 2019-05-12 ENCOUNTER — Other Ambulatory Visit: Payer: Self-pay

## 2019-05-12 ENCOUNTER — Encounter: Payer: Self-pay | Admitting: Neurology

## 2019-05-12 ENCOUNTER — Telehealth (INDEPENDENT_AMBULATORY_CARE_PROVIDER_SITE_OTHER): Payer: BLUE CROSS/BLUE SHIELD | Admitting: Neurology

## 2019-05-12 VITALS — Ht 65.0 in | Wt 142.0 lb

## 2019-05-12 DIAGNOSIS — G40309 Generalized idiopathic epilepsy and epileptic syndromes, not intractable, without status epilepticus: Secondary | ICD-10-CM | POA: Diagnosis not present

## 2019-05-12 DIAGNOSIS — F419 Anxiety disorder, unspecified: Secondary | ICD-10-CM

## 2019-05-12 MED ORDER — ZONISAMIDE 100 MG PO CAPS: ORAL_CAPSULE | ORAL | 3 refills | Status: DC

## 2019-05-12 MED ORDER — LORAZEPAM 0.5 MG PO TABS: ORAL_TABLET | ORAL | 5 refills | Status: DC
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
## 2019-05-13 ENCOUNTER — Other Ambulatory Visit: Payer: Self-pay

## 2019-05-13 DIAGNOSIS — F419 Anxiety disorder, unspecified: Secondary | ICD-10-CM

## 2019-06-15 ENCOUNTER — Ambulatory Visit (INDEPENDENT_AMBULATORY_CARE_PROVIDER_SITE_OTHER): Payer: BLUE CROSS/BLUE SHIELD | Admitting: Licensed Clinical Social Worker

## 2019-06-15 ENCOUNTER — Encounter (HOSPITAL_COMMUNITY): Payer: Self-pay | Admitting: Licensed Clinical Social Worker

## 2019-06-15 ENCOUNTER — Other Ambulatory Visit: Payer: Self-pay

## 2019-06-15 DIAGNOSIS — F489 Nonpsychotic mental disorder, unspecified: Secondary | ICD-10-CM

## 2019-06-15 NOTE — Progress Notes (Signed)
Clinician called Loriel for [facility]. Brittney Garner reported that she was unable to complete the assessment at that time and that she did not realize the appointment had been scheduled. She reported that she "probably" wanted therapy, but did not commit to rescheduling the appointment. Clinician encouraged Kei to call back if and when she was ready for services.

## 2019-06-21 ENCOUNTER — Telehealth: Payer: Self-pay | Admitting: Neurology

## 2019-06-21 NOTE — Telephone Encounter (Signed)
Patient left message on voicemail regarding her needing a letter from Dr. Delice Lesch stating that she can have an accomodation for taking her Exam in person rather than online. Thank you

## 2019-06-21 NOTE — Telephone Encounter (Signed)
Dr. Delice Lesch,  Pt needs this letter stating that she has epilepsy and requires more time to take her final exam. She would also like to take the exam at home. Pt says that a similar letter was written last year.

## 2019-06-21 NOTE — Telephone Encounter (Signed)
What type of accommodations does she require? Why does she want to take the test at school instead? Left message for pt to return call.

## 2019-07-11 ENCOUNTER — Emergency Department (HOSPITAL_COMMUNITY)
Admission: EM | Admit: 2019-07-11 | Discharge: 2019-07-11 | Discharge status: Against Medical Advice | Payer: BLUE CROSS/BLUE SHIELD

## 2019-07-11 ENCOUNTER — Other Ambulatory Visit: Payer: Self-pay

## 2019-07-11 NOTE — ED Notes (Signed)
Patient called for triage x3 

## 2019-07-11 NOTE — ED Notes (Signed)
Pt called for triage, no response from lobby 

## 2019-10-13 ENCOUNTER — Telehealth: Payer: Self-pay

## 2019-10-13 ENCOUNTER — Ambulatory Visit: Payer: BLUE CROSS/BLUE SHIELD

## 2019-10-13 NOTE — Telephone Encounter (Signed)
The Zonisamide we started can help with both seizures and headaches. Increase to 2 caps qhs. Does she want to come in for a migraine cocktail to hopefully break the headache cycle?

## 2019-10-13 NOTE — Telephone Encounter (Signed)
Migraines everyday, takes motrin that was prescribed. Nothing seems to help at all. Can't sleep, in pain everyday. Please call to advise.

## 2019-10-13 NOTE — Telephone Encounter (Signed)
The Zonisamide you started can help with both seizures and headaches. Increase to 2 caps qhs. Does she want to come in for a migraine cocktail to hopefully break the headache cycle? it is coming in for headache cocktail

## 2019-10-15 NOTE — Telephone Encounter (Signed)
ERROR

## 2019-11-23 ENCOUNTER — Encounter: Payer: Self-pay | Admitting: Neurology

## 2019-11-23 ENCOUNTER — Other Ambulatory Visit: Payer: Self-pay

## 2019-11-23 ENCOUNTER — Ambulatory Visit (INDEPENDENT_AMBULATORY_CARE_PROVIDER_SITE_OTHER): Payer: BLUE CROSS/BLUE SHIELD | Admitting: Neurology

## 2019-11-23 VITALS — BP 97/66 | HR 64 | Ht 65.0 in | Wt 154.8 lb

## 2019-11-23 DIAGNOSIS — G44209 Tension-type headache, unspecified, not intractable: Secondary | ICD-10-CM

## 2019-11-23 DIAGNOSIS — G40309 Generalized idiopathic epilepsy and epileptic syndromes, not intractable, without status epilepticus: Secondary | ICD-10-CM

## 2019-11-23 MED ORDER — ZONISAMIDE 100 MG PO CAPS: ORAL_CAPSULE | ORAL | 3 refills | Status: DC

## 2019-11-23 MED ORDER — AMITRIPTYLINE HCL 10 MG PO TABS: 10.0000 mg | ORAL_TABLET | Freq: Every day | ORAL | 5 refills | Status: DC

## 2019-11-23 MED ORDER — TIZANIDINE HCL 4 MG PO TABS: ORAL_TABLET | ORAL | 5 refills | Status: AC
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
## 2019-12-20 ENCOUNTER — Telehealth: Payer: Self-pay | Admitting: Neurology

## 2019-12-20 NOTE — Telephone Encounter (Signed)
Spoke to pt mother pt will take the 5/26 appointment at 1:30 she will do In person, pt mother was given the number to Behavior health she stated she was going to call them as well to get her an appointment
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
## 2019-12-20 NOTE — Telephone Encounter (Signed)
I have a cancellation on Wed 5/26 at 1:30pm if she can do that, if she had a sz, we can do virtual so she does not drive, whichever they prefer. Pls provide number for [facility], looks like she talked to one of the counselors in 05/2019. Thanks

## 2019-12-22 ENCOUNTER — Ambulatory Visit (INDEPENDENT_AMBULATORY_CARE_PROVIDER_SITE_OTHER): Payer: BLUE CROSS/BLUE SHIELD | Admitting: Neurology

## 2019-12-22 ENCOUNTER — Other Ambulatory Visit: Payer: Self-pay

## 2019-12-22 ENCOUNTER — Encounter (HOSPITAL_COMMUNITY): Payer: Self-pay | Admitting: *Deleted

## 2019-12-22 ENCOUNTER — Emergency Department (HOSPITAL_COMMUNITY)
Admission: EM | Admit: 2019-12-22 | Discharge: 2019-12-22 | Disposition: A | Discharge status: Home / Self Care | Payer: BLUE CROSS/BLUE SHIELD | Attending: Emergency Medicine | Admitting: Emergency Medicine

## 2019-12-22 ENCOUNTER — Encounter: Payer: Self-pay | Admitting: Neurology

## 2019-12-22 VITALS — BP 122/82 | HR 89 | Resp 20 | Ht 62.0 in | Wt 155.0 lb

## 2019-12-22 DIAGNOSIS — F419 Anxiety disorder, unspecified: Secondary | ICD-10-CM | POA: Diagnosis not present

## 2019-12-22 DIAGNOSIS — Z5321 Procedure and treatment not carried out due to patient leaving prior to being seen by health care provider: Secondary | ICD-10-CM | POA: Diagnosis not present

## 2019-12-22 DIAGNOSIS — G40309 Generalized idiopathic epilepsy and epileptic syndromes, not intractable, without status epilepticus: Secondary | ICD-10-CM

## 2019-12-22 DIAGNOSIS — G44209 Tension-type headache, unspecified, not intractable: Secondary | ICD-10-CM | POA: Diagnosis not present

## 2019-12-22 DIAGNOSIS — R569 Unspecified convulsions: Secondary | ICD-10-CM | POA: Diagnosis not present
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
## 2019-12-22 NOTE — ED Notes (Signed)
Pt stating she wants to leave, encouraged pt to stay for eval. Also discussed with pt  not to be driving with seizure activity. Her friend is here to pick her up. She reports that she has a doctors office appt in the morning. Encouraged pt to return if she wishes. Ambulatory steady gait through triage doors.

## 2019-12-22 NOTE — Patient Instructions (Signed)
1. Reduce Zonisamide 100mg : Take 1 capsule every night  2. Try the muscle relaxant at the onset of headaches and see if this helps abort symptoms  3. Proceed with Psychiatry visit and discuss medications to help with anxiety that potentially helps with headaches  4. Follow-up as scheduled in August, call for any changes

## 2019-12-22 NOTE — ED Triage Notes (Signed)
Per lobby tech, pt ambulatory to lobby area reporting she was having seizures every 5 minutes, hx of the same. Stating she drove herself here tonight.  In triage, the pt says she "does not know what happened". She reports a hx of epilepsy. She is on Zonegran med for the same, taking it as prescribed. Tearful, c/o "all over body pain" and headache.

## 2019-12-22 NOTE — ED Triage Notes (Signed)
Pt placed in chair in triage for observation. While siting in chair, pt had fluttering of her eyelids, went over to assess pt. Eye lids fluttered for several seconds, she looked up at me and started talking. She has clear speech. Now sitting up talking on cell phone with her friend.

## 2020-03-02 ENCOUNTER — Ambulatory Visit: Payer: BLUE CROSS/BLUE SHIELD | Admitting: Neurology

## 2020-06-05 ENCOUNTER — Telehealth: Payer: Self-pay | Admitting: Neurology

## 2020-06-05 NOTE — Telephone Encounter (Signed)
Pt c/o: seizure Missed medications?  Yes.   Sleep deprived?  No. Alcohol intake?  No. Back to their usual baseline self?  NO . Having headaches  Current medications prescribed by Dr. Karel Jarvis: zonisamide 100mg  take 2 capsule every night,, hasn't been taken medication for a while because she wasn't having seizures or headaches she didn't think she needed to take the medication so she stopped taken it

## 2020-06-05 NOTE — Telephone Encounter (Signed)
Pt called no answer left a voice mail for her to call the office back  

## 2020-06-05 NOTE — Telephone Encounter (Signed)
Pls explain to her that this medication is a preventative for seizures and migraines, she will need to restart medication. The number one trigger for seizures is missing medications. Thanks

## 2020-06-07 NOTE — Telephone Encounter (Signed)
Pt called and it was explained to her that her medication is a preventative for seizures and migraines, she will need to restart medication. The number one trigger for seizures is missing medications she needs to take her medication every day, pt verbalized understanding

## 2020-07-21 IMAGING — CT CT RENAL STONE PROTOCOL
2 of 4 series · 17 of 46 positions shown, 19 images · non-contrast
Comparison: None available.

CLINICAL DATA: Initial evaluation for acute right flank pain.

EXAM:
CT ABDOMEN AND PELVIS WITHOUT CONTRAST
TECHNIQUE: Multidetector CT imaging of the abdomen and pelvis was performed
following the standard protocol without IV contrast.

[Series 2: axial st · axial · 0.65mm/px · z∈[+1124,+1529]mm · 14 of 91 slices shown, 16 images]
[im 5/91  soft-tissue]
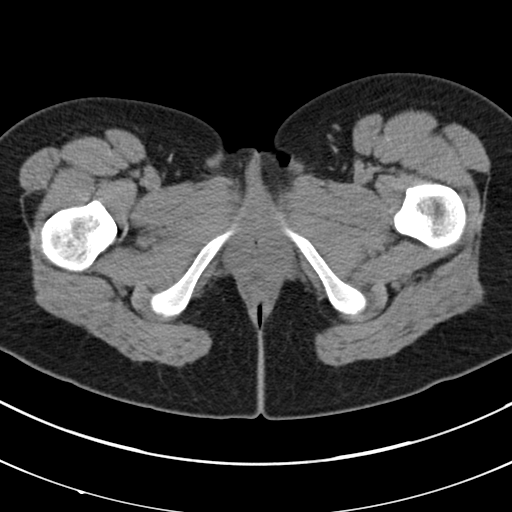
[im 5/91  bone]
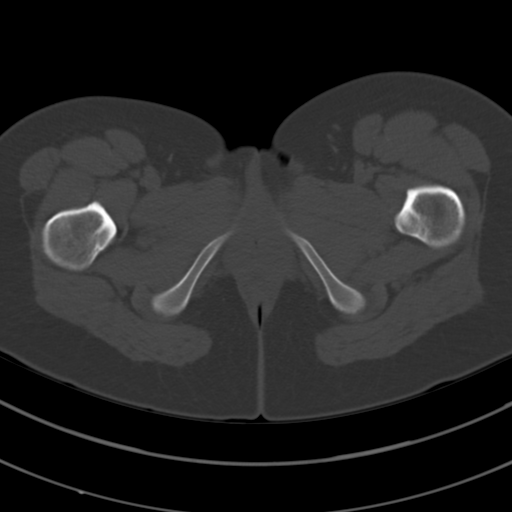
[im 13/91  soft-tissue]
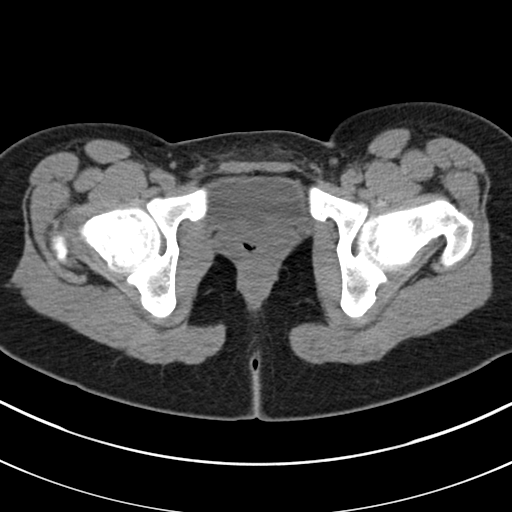
[im 18/91  soft-tissue]
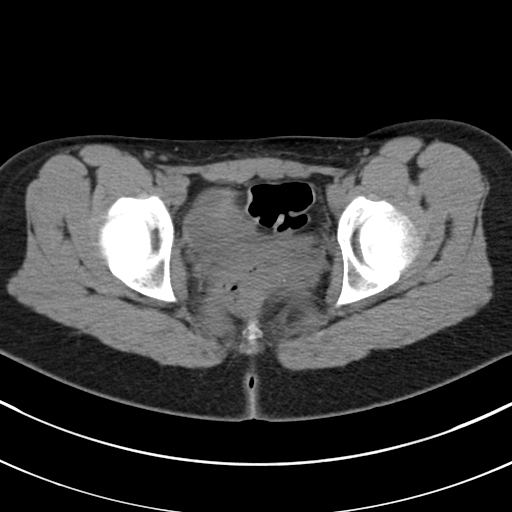
[im 26/91  soft-tissue]
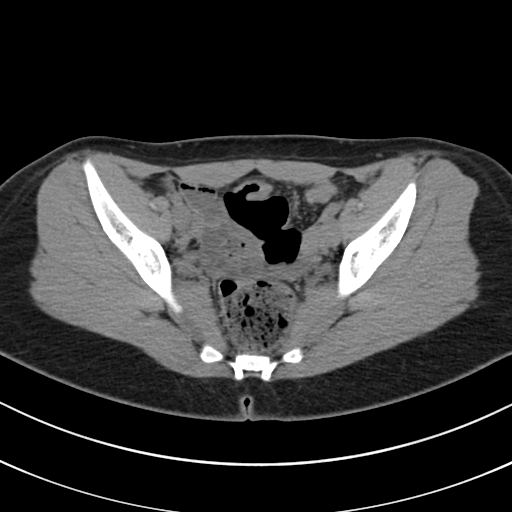
[im 31/91  soft-tissue]
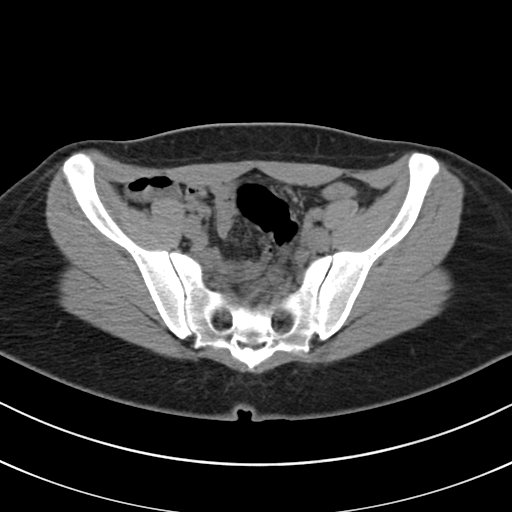
[im 35/91  soft-tissue]
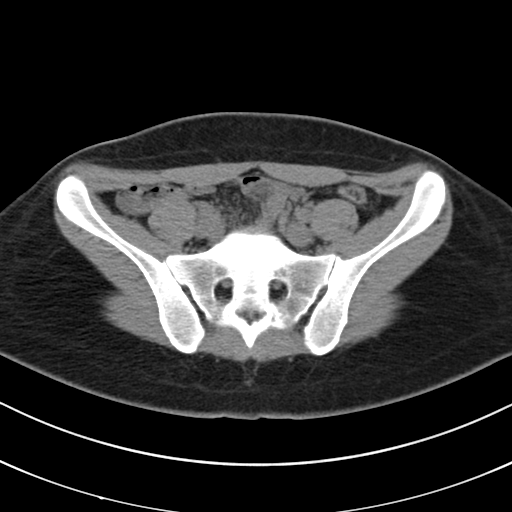
[im 43/91  soft-tissue]
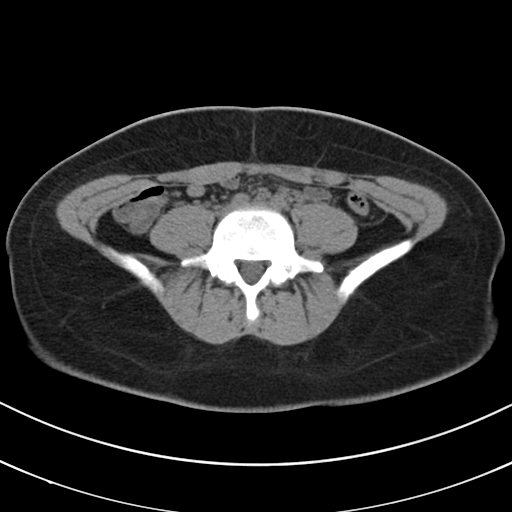
[im 48/91  soft-tissue]
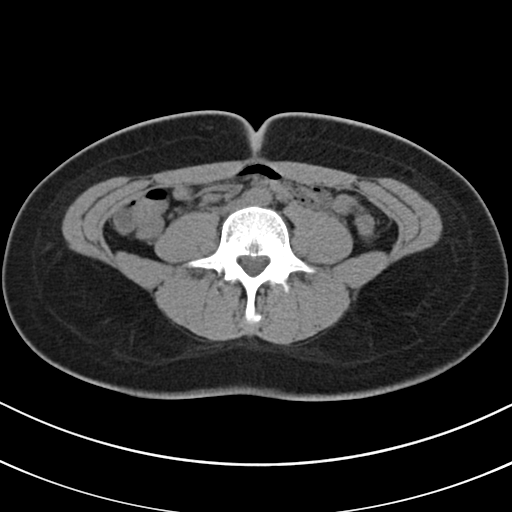
[im 56/91  soft-tissue]
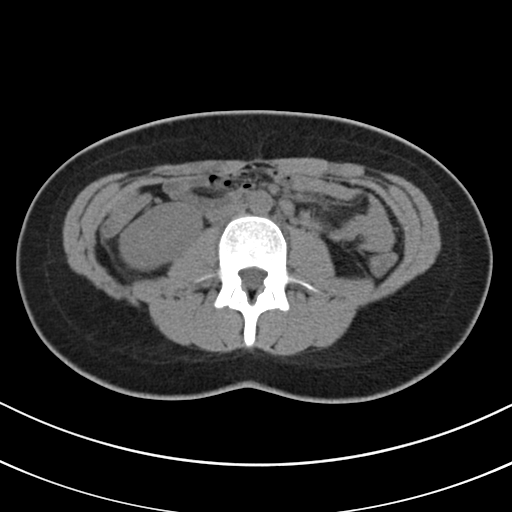
[im 56/91  bone]
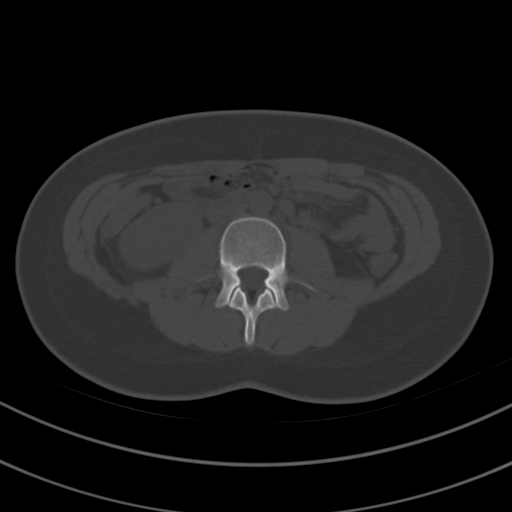
[im 61/91  soft-tissue]
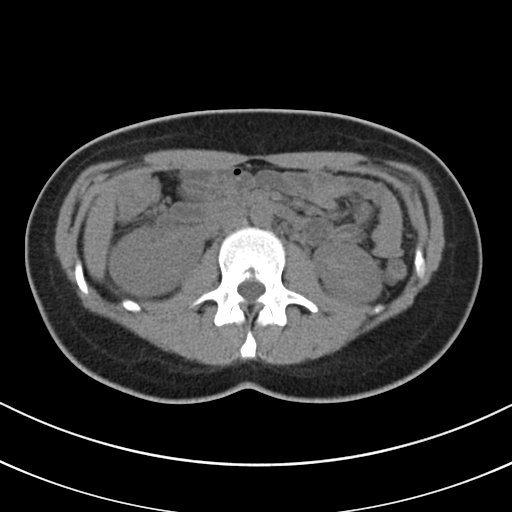
[im 69/91  soft-tissue]
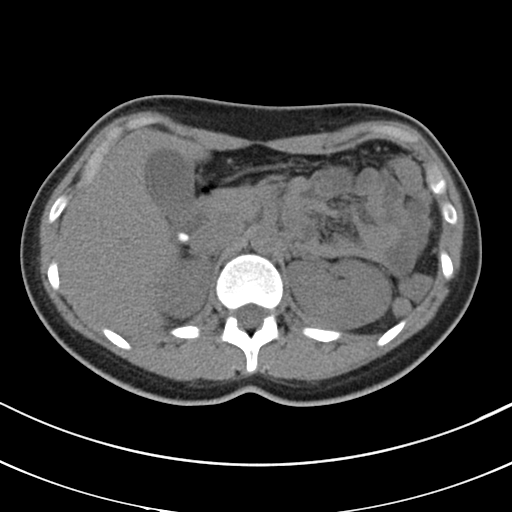
[im 73/91  soft-tissue]
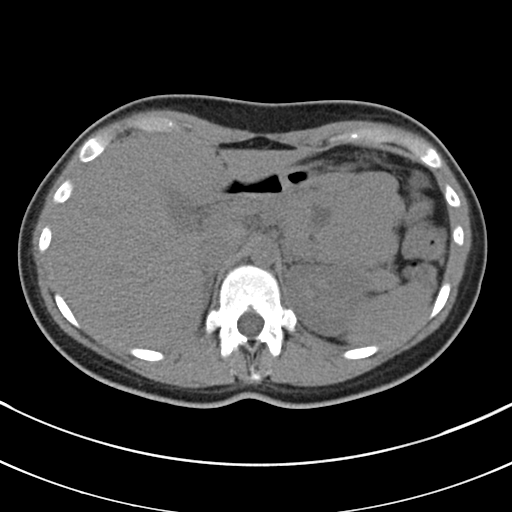
[im 78/91  soft-tissue]
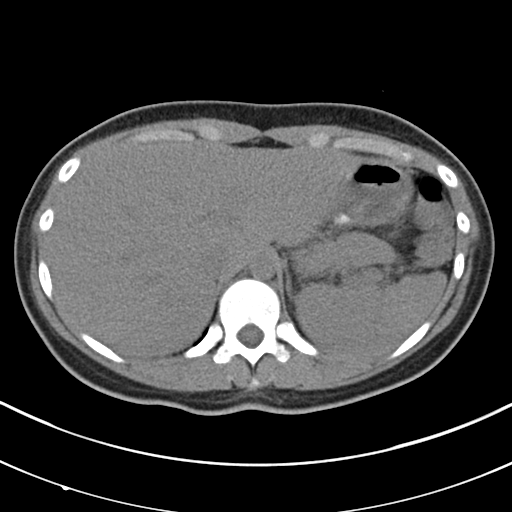
[im 86/91  soft-tissue]
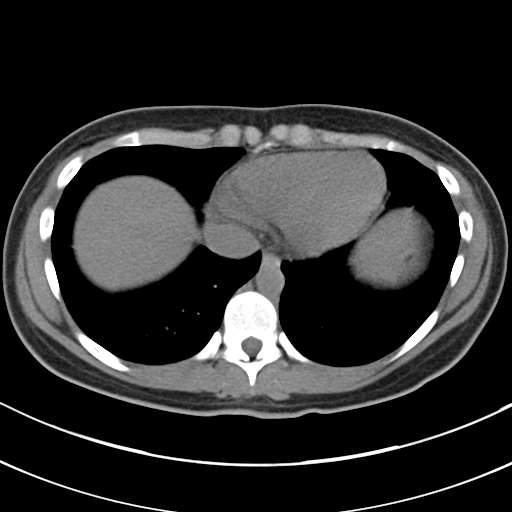

[Series 4: coronal · coronal · 0.95mm/px · 3 of 91 slices shown]
[im 31/91  soft-tissue]
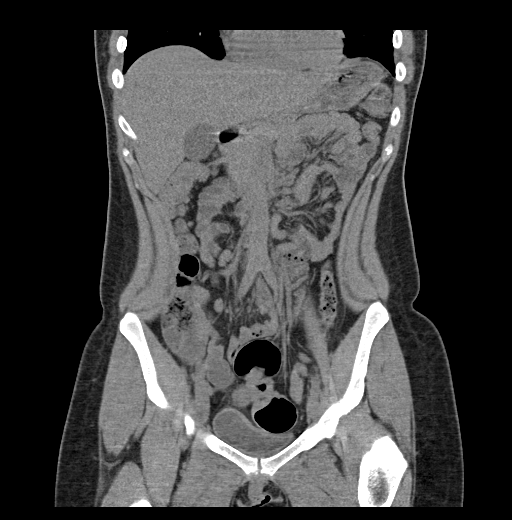
[im 41/91  soft-tissue]
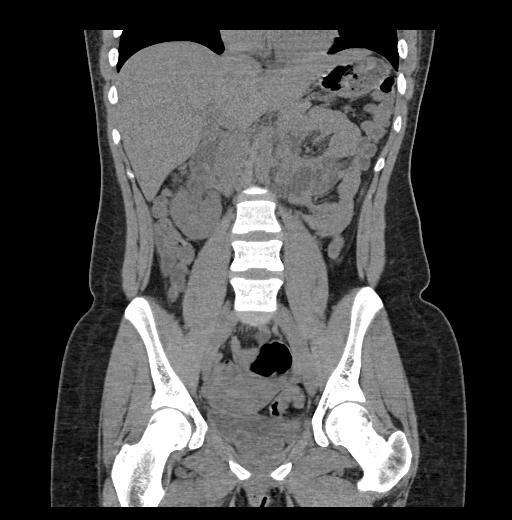
[im 51/91  soft-tissue]
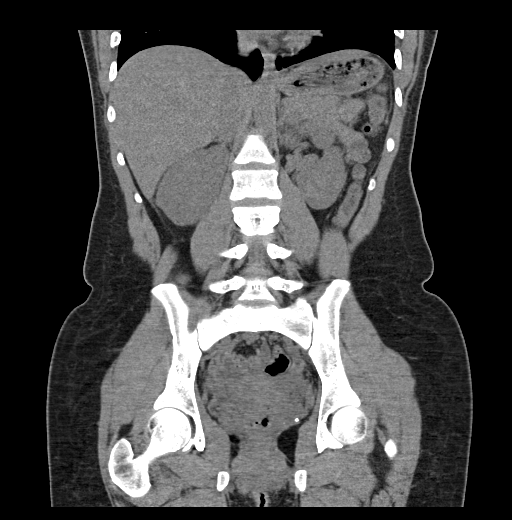

[17 of 46 positions shown; findings below may reference images not displayed]

FINDINGS: Lower chest: Visualized lung bases are clear.

Hepatobiliary: Limited noncontrast evaluation of the liver is
unremarkable. Gallbladder within normal limits. No biliary
dilatation.

Pancreas: Pancreas within normal limits.

Spleen: Spleen within normal limits.

Adrenals/Urinary Tract: Adrenal glands unremarkable. Kidneys equal
in size without nephrolithiasis or hydronephrosis. Right kidney
mildly externally rotated. No radiopaque calculi seen along the
course of either renal collecting system. No appreciable
hydroureter. Partially distended bladder within normal limits. No
layering stones within the bladder lumen. Punctate calcification
within the left hemipelvis most consistent with a vascular
phlebolith.

Stomach/Bowel: Stomach within normal limits. No evidence for bowel
obstruction. Normal appendix. No acute inflammatory changes seen
about the bowels.

Vascular/Lymphatic: Intra-abdominal aorta of normal caliber. No
appreciable adenopathy.

Reproductive: Uterus and ovaries within normal limits.

Other: No free air or fluid.

Musculoskeletal: No acute osseous abnormality. No worrisome lytic or
blastic osseous lesions.
IMPRESSION: 1. No CT evidence for nephrolithiasis or obstructive uropathy.
2. No other acute intra-abdominal or pelvic process identified.

## 2020-08-03 ENCOUNTER — Other Ambulatory Visit: Payer: Self-pay

## 2020-08-03 ENCOUNTER — Ambulatory Visit (INDEPENDENT_AMBULATORY_CARE_PROVIDER_SITE_OTHER): Payer: 59 | Admitting: Neurology

## 2020-08-03 ENCOUNTER — Encounter: Payer: Self-pay | Admitting: Neurology

## 2020-08-03 VITALS — BP 109/79 | HR 73 | Resp 20 | Ht 65.0 in | Wt 157.0 lb

## 2020-08-03 DIAGNOSIS — G43009 Migraine without aura, not intractable, without status migrainosus: Secondary | ICD-10-CM

## 2020-08-03 DIAGNOSIS — G40309 Generalized idiopathic epilepsy and epileptic syndromes, not intractable, without status epilepticus: Secondary | ICD-10-CM | POA: Diagnosis not present

## 2020-08-03 MED ORDER — LORAZEPAM 0.5 MG PO TABS: ORAL_TABLET | ORAL | 5 refills | Status: DC

## 2020-08-03 MED ORDER — ZONISAMIDE 100 MG PO CAPS: ORAL_CAPSULE | ORAL | 3 refills | Status: AC

## 2020-08-03 NOTE — Patient Instructions (Signed)
Good to see you! Continue Zonisamide 200mg  every night. Refills have been sent to your pharmacy for both the Zonisamide and Ativan as needed. Wishing you well for your upcoming surgery. Follow-up in 6 months, call for any changes.   Seizure Precautions: 1. If medication has been prescribed for you to prevent seizures, take it exactly as directed.  Do not stop taking the medicine without talking to your doctor first, even if you have not had a seizure in a long time.   2. Avoid activities in which a seizure would cause danger to yourself or to others.  Don't operate dangerous machinery, swim alone, or climb in high or dangerous places, such as on ladders, roofs, or girders.  Do not drive unless your doctor says you may.  3. If you have any warning that you may have a seizure, lay down in a safe place where you can't hurt yourself.    4.  No driving for 6 months from last seizure, as per [facility].   Please refer to the following link on the Epilepsy Foundation of America's website for more information: http://www.epilepsyfoundation.org/answerplace/Social/driving/drivingu.cfm   5.  Maintain good sleep hygiene. Avoid alcohol  6.  Notify your neurology if you are planning pregnancy or if you become pregnant.  7.  Contact your doctor if you have any problems that may be related to the medicine you are taking.  8.  Call 911 and bring the patient back to the ED if:        A.  The seizure lasts longer than 5 minutes.       B.  The patient doesn't awaken shortly after the seizure  C.  The patient has new problems such as difficulty seeing, speaking or moving  D.  The patient was injured during the seizure  E.  The patient has a temperature over 102 F (39C)  F.  The patient vomited and now is having trouble breathing
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
## 2020-11-09 ENCOUNTER — Telehealth: Payer: Self-pay | Admitting: Neurology

## 2020-11-09 MED ORDER — LORAZEPAM 0.5 MG PO TABS: ORAL_TABLET | ORAL | 5 refills | Status: AC

## 2020-11-09 NOTE — Telephone Encounter (Signed)
Called and spoke to patients mother. Patients mother stated that she called EMS to check on her daughter because patient was complaining about her terrible migraine and she is not here in [place] with her and wanted someone to check on her daughter. EMS stated that there was nothing they could do for patient because she was on Epilepsy medication. Patients mother stated that ever since patient has began taking her epilepsy medication she has been suffering from constant migraines and is having a hard holding her eyes open. Patient had told her mom that she wants to just stop her medication and have seizures vs her migraines.  Asked patients mother if Patient has been taking her Ativan. Per Herbert Seta the Ativan can help her migraines. Patients mother stated "no" because her Rx is with her at her house (they live out of town and patient lives in [place]). Patients mother would like to know if patient can have another Rx of Ativan sent so patient can start taking it again to see if it can help her migraines?  Patients mother stated that daughter is at Urgent care and they told her there is nothing that can be done for her. Patients mother asked if we are able to provide a headache cocktail? Informed her that 3:30pm is the latest we do headache cocktails and that she can call us back before 3:30pmon Monday if a cocktail is still needed.   Advised patients mother that we recommend patient being checked out by a urgent care or the emergency room. Patients mother verbalized understanding and had no further questions or concerns.   Informed patients mother that I would send this message to Dr. Karel Jarvis, however she is on vacation for 2 weeks but there are other physicians in office that will be assisting with her patients. Advised patients mother that I would send this encounter and request for an Rx of Ativan to be sent to patients pharmacy/

## 2020-11-09 NOTE — Telephone Encounter (Signed)
Called patients mother and asked how patient has been taking her Zonisamide? patients mother stated that Brittney Garner has been taking one capsule at bedtime already because of the migraines she has been having, she reduced herself to one capsule. Advised patients mother of Dr. Rosalyn Gess recommendations about Ativan and using it for seizures and patients mother verbalized understanding. Patients mother aware that Rx of Ativan has been sent to pharmacy. Patient is aware to seek care at the emergency room if symptoms worsen.

## 2020-11-09 NOTE — Telephone Encounter (Signed)
Pls confirm how she is taking her medication. If Zonisamide is making her have headaches, have her reduce dose to 1 capsule instead of 2 caps every night. Rx for Ativan sent, but would use this more for seizure rather than migraine. She can take 1 now to rest, otherwise would use for seizure. Thanks

## 2020-11-09 NOTE — Telephone Encounter (Signed)
Patient's mother called in and left a message stating the patient has been having a migraine since she started her Epilepsy medication. They were wanting to find out if she could get a headache cocktail today? The EMS was called, but they would not do anything since she was on medication for her epilepsy.

## 2021-01-31 ENCOUNTER — Ambulatory Visit: Payer: No Typology Code available for payment source | Admitting: Neurology

## 2021-02-02 ENCOUNTER — Ambulatory Visit: Payer: 59 | Admitting: Neurology

## 2021-08-31 ENCOUNTER — Telehealth: Payer: Self-pay | Admitting: Neurology

## 2021-08-31 DIAGNOSIS — G43009 Migraine without aura, not intractable, without status migrainosus: Secondary | ICD-10-CM

## 2021-08-31 DIAGNOSIS — R569 Unspecified convulsions: Secondary | ICD-10-CM

## 2021-08-31 DIAGNOSIS — G40309 Generalized idiopathic epilepsy and epileptic syndromes, not intractable, without status epilepticus: Secondary | ICD-10-CM

## 2021-08-31 NOTE — Telephone Encounter (Signed)
Pt called no answer left a voice mail to call the office back to get more information

## 2021-08-31 NOTE — Telephone Encounter (Signed)
It's hard to say what it is without seeing her, we may need to repeat the EEG. But if she is in [place], may be best to get appt first with a primary care doctor and referral to neuro there? Any seizures? Is she still on the ZOnisamide 200mg  qhs?

## 2021-08-31 NOTE — Telephone Encounter (Signed)
Pt called informed that referral was faxed if she doesn't hear from them by med next week call them. Dr Karel Jarvis stated she may need an EEG but she needs to be seen and we cant see her with her living in [place] so she needs to get in with the new neurologist, pt verbalized understanding,

## 2021-08-31 NOTE — Telephone Encounter (Signed)
Patient called back to talk to [facility].

## 2021-08-31 NOTE — Telephone Encounter (Signed)
Pt left message with AN. She stated she is twitching really bad and has poor memory.  Muscle jerks, shudders, tics  I called pt back no answer

## 2021-08-31 NOTE — Telephone Encounter (Signed)
Ok to send referral for transfer of care, thanks ?

## 2021-08-31 NOTE — Telephone Encounter (Signed)
Doesn't think its a seizure its not like her focal seizure,  she knows what's going on, pt did just have oral surgery asking if that could have something to do with it? She is in [place] now, she is going to send [place] information so that we can send her new pt referral to a neurologist in [place]. She said that the other day she was talking and it happened her phone went flying and then one day it happened she was brushing her teeth and she was covered in toothpaste. Pt was told I would call her back after talking to Dr Karel Jarvis

## 2021-08-31 NOTE — Telephone Encounter (Signed)
She is not taken the Zonisamide she said that it made her feel crazy, she has not has any of her focal seizures, she stated that this all started after the surgery and they ( the oral surgeon ) told her to call her neurologist because she may need to restart her medication. She dose not have a PCP at this time in [place]. The Neurologist she found and would like for use to send referral to is [facility] phone number [phone]
